# Patient Record
Sex: Male | Born: 1985 | Race: Black or African American | Hispanic: No | Marital: Married | Smoking: Never smoker
Health system: Southern US, Community
[De-identification: ages and names within clinical notes are randomized; demographics above are authoritative.]

## PROBLEM LIST (undated history)

## (undated) DIAGNOSIS — F32A Depression, unspecified: Secondary | ICD-10-CM

## (undated) DIAGNOSIS — J45909 Unspecified asthma, uncomplicated: Secondary | ICD-10-CM

## (undated) DIAGNOSIS — J301 Allergic rhinitis due to pollen: Secondary | ICD-10-CM

## (undated) HISTORY — DX: Unspecified asthma, uncomplicated: J45.909

## (undated) HISTORY — DX: Allergic rhinitis due to pollen: J30.1

## (undated) HISTORY — DX: Depression, unspecified: F32.A

## (undated) HISTORY — PX: LITHOTRIPSY: SUR834

---

## 2011-01-17 DIAGNOSIS — J309 Allergic rhinitis, unspecified: Secondary | ICD-10-CM | POA: Insufficient documentation

## 2012-04-14 DIAGNOSIS — J45909 Unspecified asthma, uncomplicated: Secondary | ICD-10-CM | POA: Insufficient documentation

## 2013-02-25 DIAGNOSIS — N200 Calculus of kidney: Secondary | ICD-10-CM | POA: Insufficient documentation

## 2014-03-03 DIAGNOSIS — E785 Hyperlipidemia, unspecified: Secondary | ICD-10-CM | POA: Insufficient documentation

## 2016-07-02 ENCOUNTER — Emergency Department: Payer: BC Managed Care – PPO

## 2016-07-02 ENCOUNTER — Emergency Department
Admission: EM | Admit: 2016-07-02 | Discharge: 2016-07-02 | Disposition: A | Discharge status: Home / Self Care | Payer: BC Managed Care – PPO | Attending: Emergency Medicine | Admitting: Emergency Medicine

## 2016-07-02 DIAGNOSIS — J9801 Acute bronchospasm: Secondary | ICD-10-CM

## 2016-07-02 DIAGNOSIS — R05 Cough: Secondary | ICD-10-CM | POA: Diagnosis present

## 2016-07-02 LAB — INFLUENZA PANEL BY PCR (TYPE A & B)
INFLAPCR: NEGATIVE
Influenza B By PCR: NEGATIVE

## 2016-07-02 MED ORDER — ALBUTEROL SULFATE HFA 108 (90 BASE) MCG/ACT IN AERS: 2.0000 | INHALATION_SPRAY | Freq: Four times a day (QID) | RESPIRATORY_TRACT | 0 refills | Status: AC | PRN

## 2016-07-02 MED ORDER — ALBUTEROL SULFATE (2.5 MG/3ML) 0.083% IN NEBU
5.0000 mg | INHALATION_SOLUTION | Freq: Once | RESPIRATORY_TRACT | Status: AC
  Administered 2016-07-02: 5 mg via RESPIRATORY_TRACT
  Filled 2016-07-02: qty 6

## 2016-07-02 NOTE — ED Triage Notes (Signed)
Pt ambulatory to triage with no difficulty. Pt reports started yesterday with cough and wheezing. Pt reports he has been out of his inhaler for awhile so did not have that to use. Pt talking in full and complete sentences with no difficulty at time. Pt denies chest pain, fever or other sx.

## 2016-07-02 NOTE — ED Provider Notes (Signed)
[facility] Emergency Department Provider Note ____________________________________________   I have reviewed the triage vital signs and the triage nursing note.  HISTORY  Chief Complaint Asthma and Cough   Historian Patient  HPI Patrick Wallace is a 31 y.o. male with a prior diagnosis of asthma, although he says his last issue with asthma was almost 10 years ago. Yesterday he started having a cough which got worse and wheezy over the course of the day and overnight. No fever. He has some aches but he thinks it is from coughing so much. No chest pain. No nausea or vomiting. No diarrhea. Minor nasal congestion. Feels improved after DuoNeb given in triage.    No past medical history on file. Asthma  There are no active problems to display for this patient.   No past surgical history on file.  Prior to Admission medications   Medication Sig Start Date End Date Taking? Authorizing Provider  albuterol (PROVENTIL HFA;VENTOLIN HFA) 108 (90 Base) MCG/ACT inhaler Inhale 2 puffs into the lungs every 6 (six) hours as needed for wheezing or shortness of breath. 07/02/16   Governor Rooksebecca [place] Droll, MD    No Known Allergies  No family history on file.  Social History Social History  Substance Use Topics  . Smoking status: Not on file  . Smokeless tobacco: Not on file  . Alcohol use Not on file    Review of Systems  Constitutional: Negative for fever. Eyes: Negative for visual changes. ENT: Negative for sore throat. Cardiovascular: Negative for chest pain. Respiratory: Positive for cough/shortness of breath. Gastrointestinal: Negative for abdominal pain, vomiting and diarrhea. Genitourinary: Negative for dysuria. Musculoskeletal: Negative for back pain. Skin: Negative for rash. Neurological: Negative for headache. 10 point Review of Systems otherwise negative ____________________________________________   PHYSICAL EXAM:  VITAL SIGNS: ED Triage Vitals  Enc Vitals  Group     BP 07/02/16 0511 135/66     Pulse Rate 07/02/16 0511 (!) 108     Resp 07/02/16 0511 (!) 22     Temp 07/02/16 0511 98.9 F (37.2 C)     Temp Source 07/02/16 0511 Axillary     SpO2 07/02/16 0511 100 %     Weight 07/02/16 0457 272 lb (123.4 kg)     Height 07/02/16 0457 5\' 10"  (1.778 m)     Head Circumference --      Peak Flow --      Pain Score 07/02/16 0457 0     Pain Loc --      Pain Edu? --      Excl. in GC? --      Constitutional: Alert and oriented. Well appearing and in no distress. HEENT   Head: Normocephalic and atraumatic.      Eyes: Conjunctivae are normal. PERRL. Normal extraocular movements.      Ears:         Nose: No congestion/rhinnorhea.   Mouth/Throat: Mucous membranes are moist.   Neck: No stridor. Cardiovascular/Chest: Normal rate, regular rhythm.  No murmurs, rubs, or gallops. Respiratory: Normal respiratory effort without tachypnea nor retractions. Slightly tight breath sounds, but no wheezing, rales or rhonchi at this point after DuoNeb. Gastrointestinal: Soft. No distention, no guarding, no rebound. Nontender. Obese  Genitourinary/rectal:Deferred Musculoskeletal: Nontender with normal range of motion in all extremities. No joint effusions.  No lower extremity tenderness.  No edema. Neurologic:  Normal speech and language. No gross or focal neurologic deficits are appreciated. Skin:  Skin is warm, dry and intact. No rash noted.  Psychiatric: Mood and affect are normal. Speech and behavior are normal. Patient exhibits appropriate insight and judgment.   ____________________________________________  LABS (pertinent positives/negatives)  Labs Reviewed  INFLUENZA PANEL BY PCR (TYPE A & B, H1N1)    ____________________________________________    EKG I, Governor Rooks, MD, the attending physician have personally viewed and interpreted all ECGs.  128 bpm.  Sinus Tachycardia.  Narrow QRS. Normal axis. Normal ST and  T-wave ____________________________________________  RADIOLOGY All Xrays were viewed by me. Imaging interpreted by Radiologist.  Chest x-ray 2 views: No acute pulmonary process. __________________________________________  PROCEDURES  Procedure(s) performed: None  Critical Care performed: None  ____________________________________________   ED COURSE / ASSESSMENT AND PLAN  Pertinent labs & imaging results that were available during my care of the patient were reviewed by me and considered in my medical decision making (see chart for details).   Patrick Wallace is overall well-appearing with a history of coughing/bronchospasm times one day which I'm seeing him after he received a DuoNeb treatment and he states he feels much better and his lungs are pretty clear only a slight amount of tightness appreciated. Given the fluid is so prevalent right now, I am checking him for the flu. Chest x-ray is negative for focal infiltrate.  Symptoms do not seem consistent with cardiac emergency, or PE.  I discussed with him that since he has not had no exacerbation in years, and symptoms seem essentially resolved with just albuterol, at this point I'm not recommending prednisone, although that would be next step if he has continued problems. He does have a primary care doctor that he says he can follow-up with.    CONSULTATIONS:   None  Patient / Family / Caregiver informed of clinical course, medical decision-making process, and agree with plan.   I discussed return precautions, follow-up instructions, and discharge instructions with patient and/or family.   ___________________________________________   FINAL CLINICAL IMPRESSION(S) / ED DIAGNOSES   Final diagnoses:  Bronchospasm, acute              Note: This dictation was prepared with Dragon dictation. Any transcriptional errors that result from this process are unintentional    Governor Rooks, MD 07/02/16 [phone]

## 2016-07-02 NOTE — Discharge Instructions (Signed)
You were evaluated for cough and wheezing and are being treated with albuterol inhaler.  We discussed, holding off on prednisone which is a steroid at this point in time because it seems like you're improving just with the albuterol. If you have worsening symptoms, this may be next step.  Return to the emergency room immediately for any worsening trouble breathing, shortness breath, chest pain, pain with breathing, dizziness or passing out, or any other symptoms concerning to you.

## 2016-07-02 NOTE — ED Notes (Signed)
EKG was signed off by DR. Zenda AlpersWebster

## 2017-12-28 IMAGING — CR DG CHEST 2V
2 series · 2 of 2 positions shown · non-contrast
Comparison: None.

CLINICAL DATA: Shortness of breath.  Cough and wheezing.

EXAM:
CHEST  2 VIEW

[chest pa]
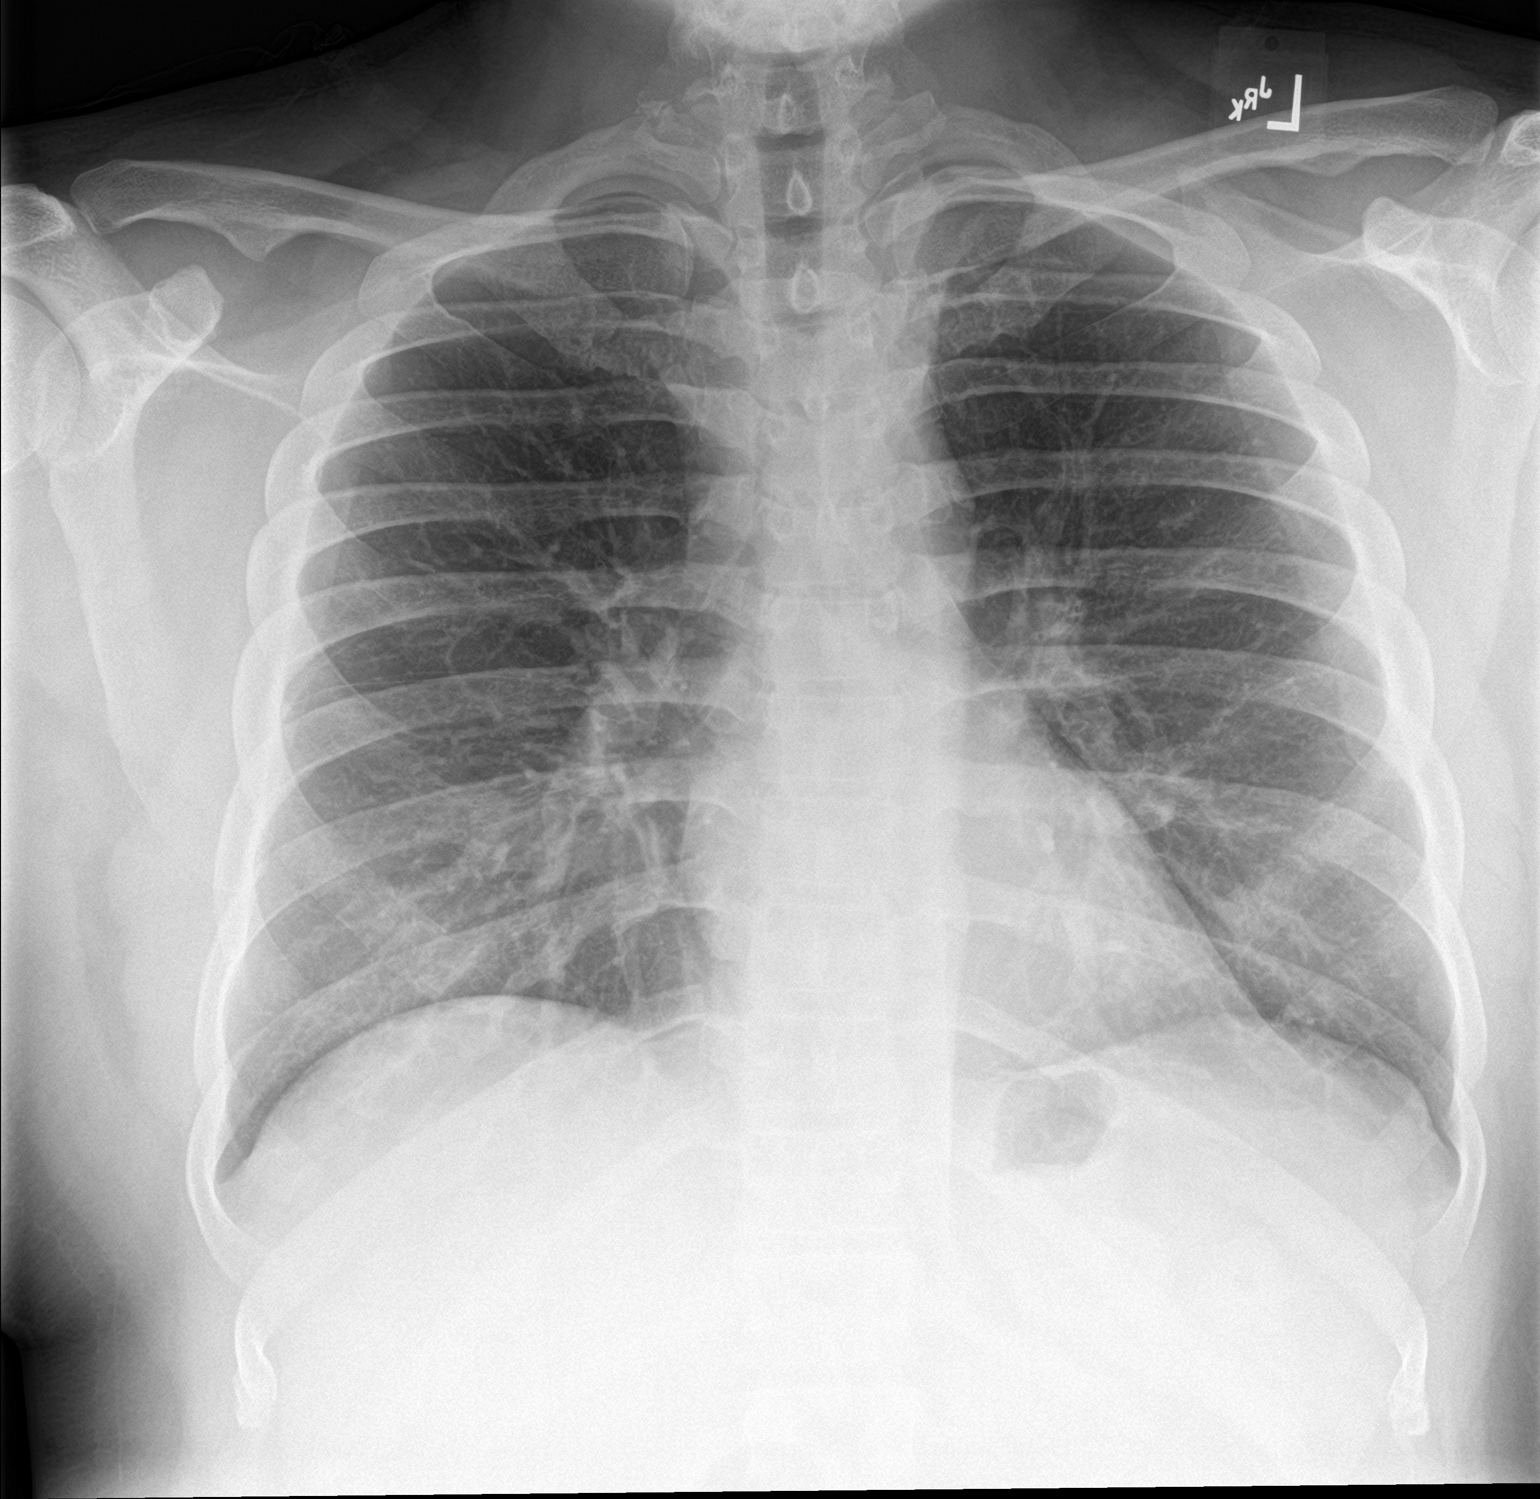

[chest lat]
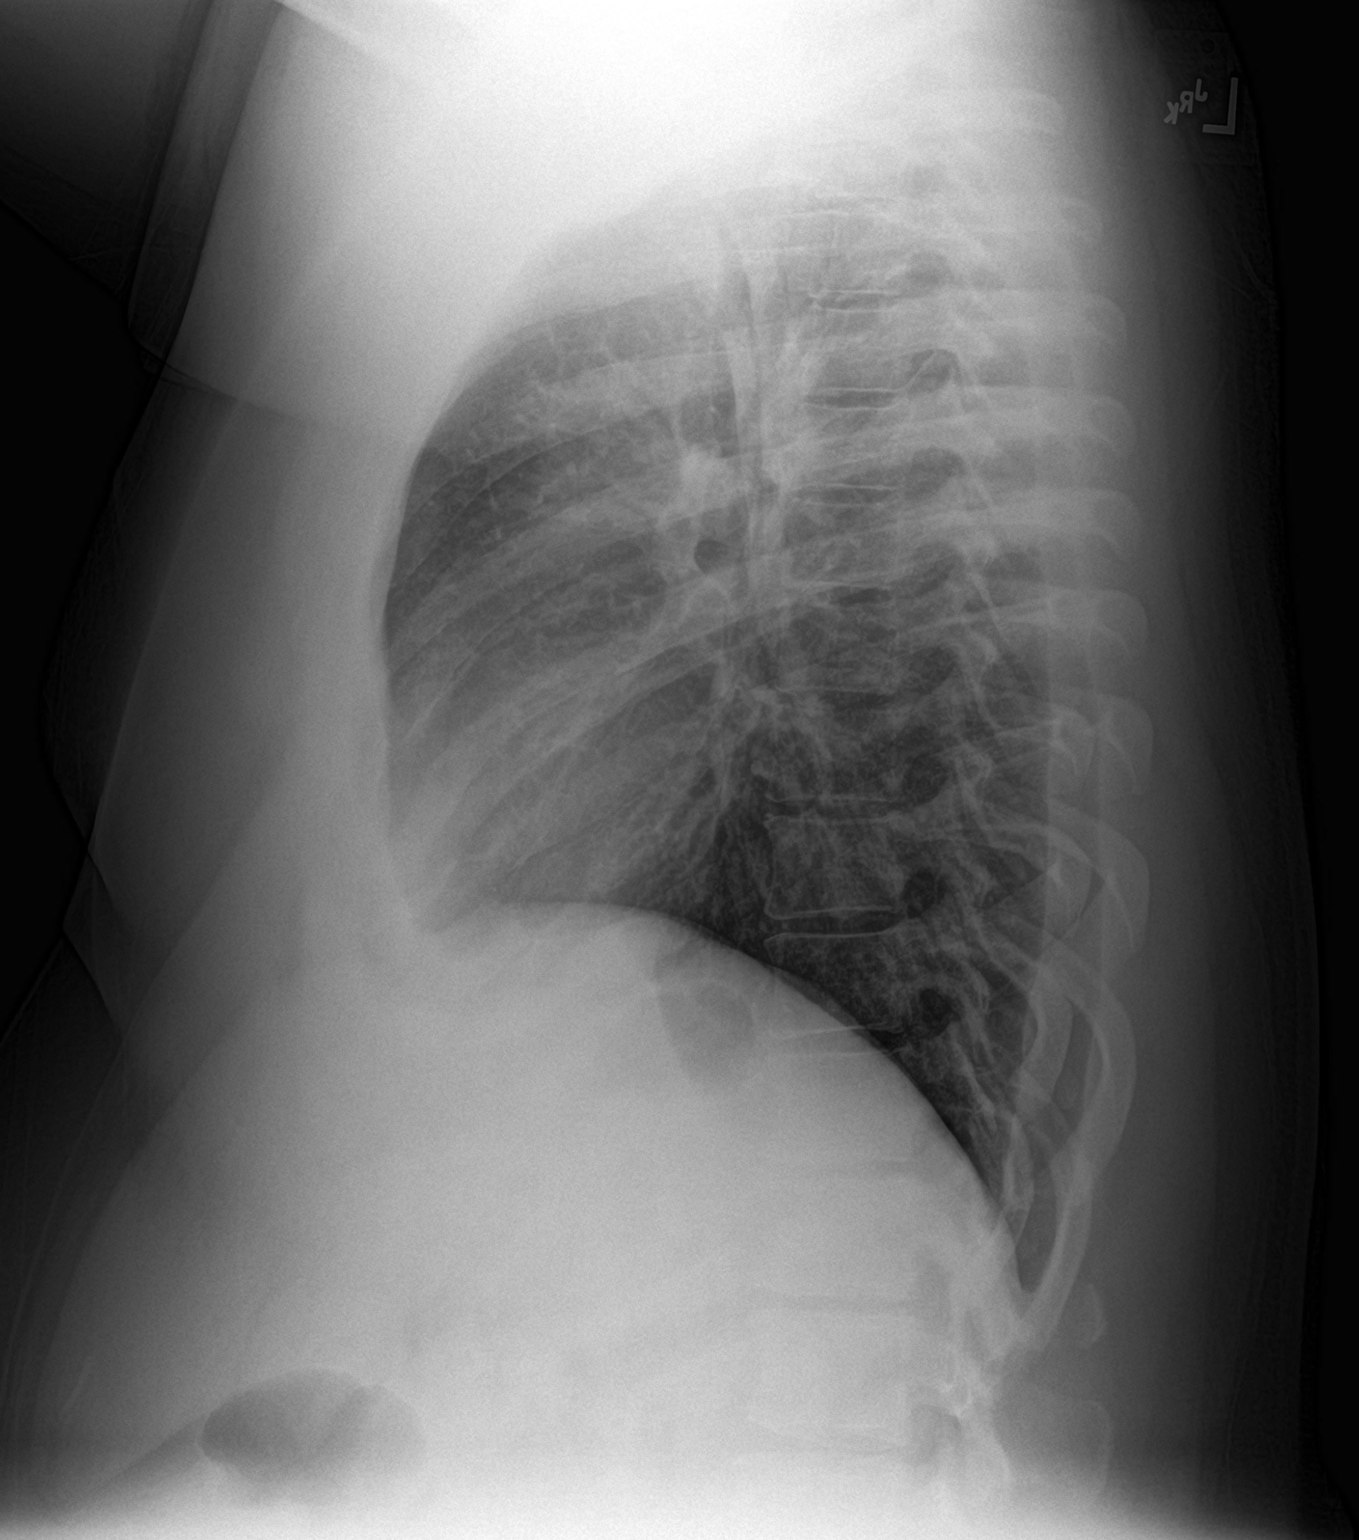

[2 of 2 positions shown; findings below may reference images not displayed]

FINDINGS: The cardiomediastinal contours are normal. The lungs are clear.
Pulmonary vasculature is normal. No consolidation, pleural effusion,
or pneumothorax. No acute osseous abnormalities are seen.
IMPRESSION: No acute pulmonary process.

## 2018-07-11 DIAGNOSIS — I10 Essential (primary) hypertension: Secondary | ICD-10-CM | POA: Insufficient documentation

## 2020-06-26 ENCOUNTER — Other Ambulatory Visit: Payer: Self-pay

## 2020-12-09 ENCOUNTER — Ambulatory Visit: Payer: BC Managed Care – PPO | Admitting: Allergy

## 2020-12-13 ENCOUNTER — Encounter: Payer: Self-pay | Admitting: Emergency Medicine

## 2020-12-13 ENCOUNTER — Other Ambulatory Visit: Payer: Self-pay

## 2020-12-13 ENCOUNTER — Ambulatory Visit: Payer: BC Managed Care – PPO | Admitting: Emergency Medicine

## 2020-12-13 VITALS — BP 138/90 | HR 81 | Temp 98.5°F | Ht 71.0 in | Wt 293.6 lb

## 2020-12-13 DIAGNOSIS — I10 Essential (primary) hypertension: Secondary | ICD-10-CM | POA: Diagnosis not present

## 2020-12-13 LAB — CBC WITH DIFFERENTIAL/PLATELET
Basophils Absolute: 0 10*3/uL (ref 0.0–0.1)
Basophils Relative: 0.2 % (ref 0.0–3.0)
Eosinophils Absolute: 0.1 10*3/uL (ref 0.0–0.7)
Eosinophils Relative: 1 % (ref 0.0–5.0)
HCT: 42.9 % (ref 39.0–52.0)
Hemoglobin: 14.6 g/dL (ref 13.0–17.0)
Lymphocytes Relative: 21.9 % (ref 12.0–46.0)
Lymphs Abs: 1.9 10*3/uL (ref 0.7–4.0)
MCHC: 34 g/dL (ref 30.0–36.0)
MCV: 90.1 fl (ref 78.0–100.0)
Monocytes Absolute: 1.1 10*3/uL — ABNORMAL HIGH (ref 0.1–1.0)
Monocytes Relative: 12.5 % — ABNORMAL HIGH (ref 3.0–12.0)
Neutro Abs: 5.6 10*3/uL (ref 1.4–7.7)
Neutrophils Relative %: 64.4 % (ref 43.0–77.0)
Platelets: 287 10*3/uL (ref 150.0–400.0)
RBC: 4.77 Mil/uL (ref 4.22–5.81)
RDW: 13.3 % (ref 11.5–15.5)
WBC: 8.6 10*3/uL (ref 4.0–10.5)

## 2020-12-13 LAB — COMPREHENSIVE METABOLIC PANEL
ALT: 31 U/L (ref 0–53)
AST: 25 U/L (ref 0–37)
Albumin: 4.5 g/dL (ref 3.5–5.2)
Alkaline Phosphatase: 78 U/L (ref 39–117)
BUN: 10 mg/dL (ref 6–23)
CO2: 27 mEq/L (ref 19–32)
Calcium: 9.1 mg/dL (ref 8.4–10.5)
Chloride: 98 mEq/L (ref 96–112)
Creatinine, Ser: 1.3 mg/dL (ref 0.40–1.50)
GFR: 71.57 mL/min (ref >60.00)
Glucose, Bld: 67 mg/dL — ABNORMAL LOW (ref 70–99)
Potassium: 3.8 mEq/L (ref 3.5–5.1)
Sodium: 135 mEq/L (ref 135–145)
Total Bilirubin: 0.8 mg/dL (ref 0.2–1.2)
Total Protein: 8 g/dL (ref 6.0–8.3)

## 2020-12-13 LAB — LIPID PANEL
Cholesterol: 353 mg/dL — ABNORMAL HIGH (ref 0–200)
HDL: 31 mg/dL — ABNORMAL LOW (ref >39.00)
Total CHOL/HDL Ratio: 11
Triglycerides: 552 mg/dL — ABNORMAL HIGH (ref 0.0–149.0)

## 2020-12-13 LAB — HEMOGLOBIN A1C: Hgb A1c MFr Bld: 5.3 % (ref 4.6–6.5)

## 2020-12-13 LAB — LDL CHOLESTEROL, DIRECT: Direct LDL: 66 mg/dL

## 2020-12-13 MED ORDER — AMLODIPINE BESYLATE 5 MG PO TABS: 5.0000 mg | ORAL_TABLET | Freq: Every day | ORAL | 3 refills | Status: DC

## 2020-12-13 NOTE — Assessment & Plan Note (Signed)
Elevated blood pressure readings with a history of hypertension. Will start amlodipine 5 mg daily. Dietary approaches to stop hypertension discussed Follow-up in 3 months.

## 2020-12-13 NOTE — Progress Notes (Signed)
Patrick Wallace 35 y.o.   Chief Complaint  Patient presents with   New Patient (Initial Visit)    HISTORY OF PRESENT ILLNESS: This is a 35 y.o. male first visit to this office, here to establish care with me. History of hypertension but not on any medications at present time History of asthma with infrequent use of albuterol inhaler Non-smoker. Married father of 3. Has occasional carpal tunnel symptoms to both wrists but not affecting quality of life.  HPI   Prior to Admission medications   Medication Sig Start Date End Date Taking? Authorizing Provider  albuterol (PROVENTIL HFA;VENTOLIN HFA) 108 (90 Base) MCG/ACT inhaler Inhale 2 puffs into the lungs every 6 (six) hours as needed for wheezing or shortness of breath. 07/02/16   Governor Rooks, MD    No Known Allergies  There are no problems to display for this patient.   History reviewed. No pertinent past medical history.  History reviewed. No pertinent surgical history.  Social History   Socioeconomic History   Marital status: Married    Spouse name: Not on file   Number of children: Not on file   Years of education: Not on file   Highest education level: Not on file  Occupational History   Not on file  Tobacco Use   Smoking status: Not on file   Smokeless tobacco: Not on file  Substance and Sexual Activity   Alcohol use: Not on file   Drug use: Not on file   Sexual activity: Not on file  Other Topics Concern   Not on file  Social History Narrative   ** Merged History Encounter **       Social Determinants of Health   Financial Resource Strain: Not on file  Food Insecurity: Not on file  Transportation Needs: Not on file  Physical Activity: Not on file  Stress: Not on file  Social Connections: Not on file  Intimate Partner Violence: Not on file    History reviewed. No pertinent family history.   Review of Systems  Constitutional: Negative.  Negative for chills and fever.  HENT: Negative.  Negative  for congestion and sore throat.   Respiratory: Negative.  Negative for cough and shortness of breath.   Cardiovascular: Negative.  Negative for chest pain and palpitations.  Gastrointestinal:  Negative for abdominal pain, diarrhea, nausea and vomiting.  Genitourinary: Negative.  Negative for dysuria and hematuria.  Musculoskeletal: Negative.  Negative for myalgias.  Skin: Negative.  Negative for rash.  Neurological:  Negative for dizziness and headaches.  All other systems reviewed and are negative.   Physical Exam Vitals reviewed.  Constitutional:      Appearance: Normal appearance.  HENT:     Head: Normocephalic.  Eyes:     Extraocular Movements: Extraocular movements intact.     Conjunctiva/sclera: Conjunctivae normal.     Pupils: Pupils are equal, round, and reactive to light.  Cardiovascular:     Rate and Rhythm: Normal rate and regular rhythm.     Pulses: Normal pulses.     Heart sounds: Normal heart sounds.  Pulmonary:     Effort: Pulmonary effort is normal.     Breath sounds: Normal breath sounds.  Musculoskeletal:        General: Normal range of motion.     Cervical back: Normal range of motion and neck supple.  Skin:    General: Skin is warm and dry.  Neurological:     General: No focal deficit present.  Mental Status: He is alert and oriented to person, place, and time.  Psychiatric:        Mood and Affect: Mood normal.        Behavior: Behavior normal.     ASSESSMENT & PLAN: A total of 30 minutes was spent with the patient and counseling/coordination of care regarding establishing care with me, hypertension and cardiovascular risk associated with this condition, need to start medication amlodipine 5 mg daily, dietary approach to stop hypertension, education on nutrition and need for nutritionist referral, prognosis, documentation and need for follow-up in 3 months.  Essential hypertension Elevated blood pressure readings with a history of  hypertension. Will start amlodipine 5 mg daily. Dietary approaches to stop hypertension discussed Follow-up in 3 months.  Morbid obesity (HCC) Diet and nutrition discussed.  Advised to decrease the amount of daily carbohydrate intake Referred to medical weight management clinic. Patrick Wallace was seen today for new patient (initial visit), carpal tunnel and hypertension.  Diagnoses and all orders for this visit:  Essential hypertension -     CBC with Differential/Platelet -     Comprehensive metabolic panel -     Hemoglobin A1c -     Lipid panel -     amLODipine (NORVASC) 5 MG tablet; Take 1 tablet (5 mg total) by mouth daily.  Morbid obesity (HCC) -     Hemoglobin A1c -     Lipid panel -     Amb Ref to Medical Weight Management  Patient Instructions  Hypertension, Adult High blood pressure (hypertension) is when the force of blood pumping through the arteries is too strong. The arteries are the blood vessels that carry blood from the heart throughout the body. Hypertension forces the heart to work harder to pump blood and may cause arteries to become narrow or stiff. Untreated or uncontrolled hypertension can cause a heart attack, heart failure, a stroke, kidney disease, and otherproblems. A blood pressure reading consists of a higher number over a lower number. Ideally, your blood pressure should be below 120/80. The first ("top") number is called the systolic pressure. It is a measure of the pressure in your arteries as your heart beats. The second ("bottom") number is called the diastolic pressure. It is a measure of the pressure in your arteries as theheart relaxes. What are the causes? The exact cause of this condition is not known. There are some conditions thatresult in or are related to high blood pressure. What increases the risk? Some risk factors for high blood pressure are under your control. The following factors may make you more likely to develop this condition: Smoking. Having  type 2 diabetes mellitus, high cholesterol, or both. Not getting enough exercise or physical activity. Being overweight. Having too much fat, sugar, calories, or salt (sodium) in your diet. Drinking too much alcohol. Some risk factors for high blood pressure may be difficult or impossible to change. Some of these factors include: Having chronic kidney disease. Having a family history of high blood pressure. Age. Risk increases with age. Race. You may be at higher risk if you are African American. Gender. Men are at higher risk than women before age 8. After age 30, women are at higher risk than men. Having obstructive sleep apnea. Stress. What are the signs or symptoms? High blood pressure may not cause symptoms. Very high blood pressure (hypertensive crisis) may cause: Headache. Anxiety. Shortness of breath. Nosebleed. Nausea and vomiting. Vision changes. Severe chest pain. Seizures. How is this diagnosed? This condition  is diagnosed by measuring your blood pressure while you are seated, with your arm resting on a flat surface, your legs uncrossed, and your feet flat on the floor. The cuff of the blood pressure monitor will be placed directly against the skin of your upper arm at the level of your heart. It should be measured at least twice using the same arm. Certain conditions cancause a difference in blood pressure between your right and left arms. Certain factors can cause blood pressure readings to be lower or higher than normal for a short period of time: When your blood pressure is higher when you are in a health care provider's office than when you are at home, this is called white coat hypertension. Most people with this condition do not need medicines. When your blood pressure is higher at home than when you are in a health care provider's office, this is called masked hypertension. Most people with this condition may need medicines to control blood pressure. If you have a  high blood pressure reading during one visit or you have normal blood pressure with other risk factors, you may be asked to: Return on a different day to have your blood pressure checked again. Monitor your blood pressure at home for 1 week or longer. If you are diagnosed with hypertension, you may have other blood or imaging tests to help your health care provider understand your overall risk for otherconditions. How is this treated? This condition is treated by making healthy lifestyle changes, such as eating healthy foods, exercising more, and reducing your alcohol intake. Your health care provider may prescribe medicine if lifestyle changes are not enough to get your blood pressure under control, and if: Your systolic blood pressure is above 130. Your diastolic blood pressure is above 80. Your personal target blood pressure may vary depending on your medicalconditions, your age, and other factors. Follow these instructions at home: Eating and drinking  Eat a diet that is high in fiber and potassium, and low in sodium, added sugar, and fat. An example eating plan is called the DASH (Dietary Approaches to Stop Hypertension) diet. To eat this way: Eat plenty of fresh fruits and vegetables. Try to fill one half of your plate at each meal with fruits and vegetables. Eat whole grains, such as whole-wheat pasta, brown rice, or whole-grain bread. Fill about one fourth of your plate with whole grains. Eat or drink low-fat dairy products, such as skim milk or low-fat yogurt. Avoid fatty cuts of meat, processed or cured meats, and poultry with skin. Fill about one fourth of your plate with lean proteins, such as fish, chicken without skin, beans, eggs, or tofu. Avoid pre-made and processed foods. These tend to be higher in sodium, added sugar, and fat. Reduce your daily sodium intake. Most people with hypertension should eat less than 1,500 mg of sodium a day. Do not drink alcohol if: Your health care  provider tells you not to drink. You are pregnant, may be pregnant, or are planning to become pregnant. If you drink alcohol: Limit how much you use to: 0-1 drink a day for women. 0-2 drinks a day for men. Be aware of how much alcohol is in your drink. In the [place] one drink equals one 12 oz bottle of beer (355 mL), one 5 oz glass of wine (148 mL), or one 1 oz glass of hard liquor (44 mL).  Lifestyle  Work with your health care provider to maintain a healthy body weight or to  lose weight. Ask what an ideal weight is for you. Get at least 30 minutes of exercise most days of the week. Activities may include walking, swimming, or biking. Include exercise to strengthen your muscles (resistance exercise), such as Pilates or lifting weights, as part of your weekly exercise routine. Try to do these types of exercises for 30 minutes at least 3 days a week. Do not use any products that contain nicotine or tobacco, such as cigarettes, e-cigarettes, and chewing tobacco. If you need help quitting, ask your health care provider. Monitor your blood pressure at home as told by your health care provider. Keep all follow-up visits as told by your health care provider. This is important.  Medicines Take over-the-counter and prescription medicines only as told by your health care provider. Follow directions carefully. Blood pressure medicines must be taken as prescribed. Do not skip doses of blood pressure medicine. Doing this puts you at risk for problems and can make the medicine less effective. Ask your health care provider about side effects or reactions to medicines that you should watch for. Contact a health care provider if you: Think you are having a reaction to a medicine you are taking. Have headaches that keep coming back (recurring). Feel dizzy. Have swelling in your ankles. Have trouble with your vision. Get help right away if you: Develop a severe headache or confusion. Have unusual weakness  or numbness. Feel faint. Have severe pain in your chest or abdomen. Vomit repeatedly. Have trouble breathing. Summary Hypertension is when the force of blood pumping through your arteries is too strong. If this condition is not controlled, it may put you at risk for serious complications. Your personal target blood pressure may vary depending on your medical conditions, your age, and other factors. For most people, a normal blood pressure is less than 120/80. Hypertension is treated with lifestyle changes, medicines, or a combination of both. Lifestyle changes include losing weight, eating a healthy, low-sodium diet, exercising more, and limiting alcohol. This information is not intended to replace advice given to you by your health care provider. Make sure you discuss any questions you have with your healthcare provider. Document Revised: 02/12/2018 Document Reviewed: 02/12/2018 Elsevier Patient Education  2022 [employer].   Edwina BarthMiguel Jamilex Bohnsack, MD [facility]

## 2020-12-13 NOTE — Patient Instructions (Signed)

## 2020-12-13 NOTE — Assessment & Plan Note (Signed)
Diet and nutrition discussed.  Advised to decrease the amount of daily carbohydrate intake Referred to medical weight management clinic.

## 2020-12-14 ENCOUNTER — Other Ambulatory Visit: Payer: Self-pay | Admitting: Emergency Medicine

## 2020-12-14 DIAGNOSIS — E782 Mixed hyperlipidemia: Secondary | ICD-10-CM

## 2020-12-14 MED ORDER — ROSUVASTATIN CALCIUM 20 MG PO TABS: 20.0000 mg | ORAL_TABLET | Freq: Every day | ORAL | 3 refills | Status: DC

## 2020-12-16 ENCOUNTER — Encounter: Payer: Self-pay | Admitting: *Deleted

## 2020-12-17 ENCOUNTER — Ambulatory Visit
Admission: EM | Admit: 2020-12-17 | Discharge: 2020-12-17 | Disposition: A | Discharge status: Home / Self Care | Payer: BC Managed Care – PPO | Attending: Emergency Medicine | Admitting: Emergency Medicine

## 2020-12-17 ENCOUNTER — Encounter: Payer: Self-pay | Admitting: Emergency Medicine

## 2020-12-17 ENCOUNTER — Other Ambulatory Visit: Payer: Self-pay

## 2020-12-17 ENCOUNTER — Ambulatory Visit (INDEPENDENT_AMBULATORY_CARE_PROVIDER_SITE_OTHER): Payer: BC Managed Care – PPO

## 2020-12-17 DIAGNOSIS — K529 Noninfective gastroenteritis and colitis, unspecified: Secondary | ICD-10-CM | POA: Diagnosis not present

## 2020-12-17 DIAGNOSIS — R109 Unspecified abdominal pain: Secondary | ICD-10-CM | POA: Diagnosis not present

## 2020-12-17 DIAGNOSIS — R197 Diarrhea, unspecified: Secondary | ICD-10-CM | POA: Diagnosis not present

## 2020-12-17 LAB — POCT URINALYSIS DIP (DEVICE)
Bilirubin Urine: NEGATIVE
Glucose, UA: NEGATIVE mg/dL
Ketones, ur: NEGATIVE mg/dL
Leukocytes,Ua: NEGATIVE
Nitrite: NEGATIVE
Protein, ur: 100 mg/dL — AB
Specific Gravity, Urine: 1.025 (ref 1.005–1.030)
Urobilinogen, UA: 0.2 mg/dL (ref 0.0–1.0)
pH: 5.5 (ref 5.0–8.0)

## 2020-12-17 MED ORDER — ONDANSETRON 8 MG PO TBDP: 8.0000 mg | ORAL_TABLET | Freq: Three times a day (TID) | ORAL | 0 refills | Status: DC | PRN

## 2020-12-17 NOTE — ED Triage Notes (Signed)
Sunday had diarrhea and abdominal pain, and nauseated.  Symptoms have continued.    Child had similar symptoms prior to patient having symptoms.    Patient has right lower back pain.  Patient is concerned he is having a kidney stone, history of the same

## 2020-12-17 NOTE — ED Provider Notes (Signed)
[facility]    CSN: [id number] Arrival date & time: 12/17/20  1155      History   Chief Complaint Chief Complaint  Patient presents with   Abdominal Pain    HPI Kalik Alvizo is a 35 y.o. male.   HPI  35 year old male here for evaluation of abdominal complaints.  Patient reports that he has been experiencing abdominal cramping, which is mostly resolved, for the last 5 days.  He is also had some right-sided low back pain that will come and go in short bursts and then disperse.  This has been associated with chills and sweats.  He is also been experiencing darker urine that is cloudy in appearance.  His stools have been diarrhea with 5-6 episodes a day.  He denies any vomiting, blood in his stool or urine, painful urination, or fever.  Past Medical History:  Diagnosis Date   Asthma    Depression    Hay fever    with allergies    Patient Active Problem List   Diagnosis Date Noted   Essential hypertension 12/13/2020   Morbid obesity (HCC) 12/13/2020    Past Surgical History:  Procedure Laterality Date   LITHOTRIPSY         Home Medications    Prior to Admission medications   Medication Sig Start Date End Date Taking? Authorizing Provider  amLODipine (NORVASC) 5 MG tablet Take 1 tablet (5 mg total) by mouth daily. 12/13/20  Yes Sagardia, Eilleen Kempf, MD  ondansetron (ZOFRAN ODT) 8 MG disintegrating tablet Take 1 tablet (8 mg total) by mouth every 8 (eight) hours as needed for nausea or vomiting. 12/17/20  Yes Becky Augusta, NP  rosuvastatin (CRESTOR) 20 MG tablet Take 1 tablet (20 mg total) by mouth daily. 12/14/20  Yes Sagardia, Eilleen Kempf, MD  albuterol (PROVENTIL HFA;VENTOLIN HFA) 108 (90 Base) MCG/ACT inhaler Inhale 2 puffs into the lungs every 6 (six) hours as needed for wheezing or shortness of breath. 07/02/16   Governor Rooks, MD    Family History Family History  Problem Relation Age of Onset   Hypertension Mother    Hyperlipidemia Mother     Hypertension Father    Hyperlipidemia Father    Diabetes Father    Cancer Father    Depression Father    Heart disease Father    Kidney disease Maternal Grandfather    Hypertension Maternal Grandfather    Hyperlipidemia Maternal Grandfather    Diabetes Maternal Grandfather    Hyperlipidemia Paternal Grandmother    Hypertension Paternal Grandmother    Heart attack Paternal Grandmother    Heart disease Paternal Grandmother     Social History Social History   Tobacco Use   Smoking status: Never   Smokeless tobacco: Never  Vaping Use   Vaping Use: Never used  Substance Use Topics   Alcohol use: Yes    Alcohol/week: 2.0 standard drinks    Types: 2 Glasses of wine per week     Allergies   Patient has no known allergies.   Review of Systems Review of Systems  Constitutional:  Positive for chills and diaphoresis. Negative for activity change, appetite change and fever.  Gastrointestinal:  Positive for abdominal pain, diarrhea and nausea. Negative for blood in stool and vomiting.  Genitourinary:  Negative for dysuria, frequency and urgency.  Musculoskeletal:  Positive for back pain.  Hematological: Negative.   Psychiatric/Behavioral: Negative.      Physical Exam Triage Vital Signs ED Triage Vitals [12/17/20 1254]  Enc Vitals  Group     BP      Pulse      Resp      Temp      Temp src      SpO2      Weight      Height      Head Circumference      Peak Flow      Pain Score 3     Pain Loc      Pain Edu?      Excl. in GC?    No data found.  Updated Vital Signs BP (!) 161/93 (BP Location: Right Arm) Comment: recently started taking blood pressure medicine-12/15/2020 Comment (BP Location): large cuff  Pulse 77   Temp 98 F (36.7 C) (Oral)   Resp (!) 22   SpO2 99%   Visual Acuity Right Eye Distance:   Left Eye Distance:   Bilateral Distance:    Right Eye Near:   Left Eye Near:    Bilateral Near:     Physical Exam Vitals and nursing note reviewed.   Constitutional:      General: He is not in acute distress.    Appearance: He is well-developed. He is not ill-appearing.  HENT:     Head: Normocephalic and atraumatic.  Cardiovascular:     Rate and Rhythm: Normal rate and regular rhythm.     Pulses: Normal pulses.     Heart sounds: Normal heart sounds. No murmur heard.   No gallop.  Pulmonary:     Effort: Pulmonary effort is normal.     Breath sounds: Normal breath sounds. No wheezing, rhonchi or rales.  Abdominal:     General: Bowel sounds are normal. There is no distension.     Palpations: Abdomen is soft.     Tenderness: There is no abdominal tenderness. There is no right CVA tenderness, left CVA tenderness, guarding or rebound.  Skin:    General: Skin is warm and dry.     Capillary Refill: Capillary refill takes less than 2 seconds.  Neurological:     General: No focal deficit present.     Mental Status: He is alert and oriented to person, place, and time.  Psychiatric:        Mood and Affect: Mood normal.        Behavior: Behavior normal.        Thought Content: Thought content normal.        Judgment: Judgment normal.     UC Treatments / Results  Labs (all labs ordered are listed, but only abnormal results are displayed) Labs Reviewed  POCT URINALYSIS DIP (DEVICE) - Abnormal; Notable for the following components:      Result Value   Hgb urine dipstick SMALL (*)    Protein, ur 100 (*)    All other components within normal limits  POCT URINALYSIS DIPSTICK, ED / UC    EKG   Radiology DG Abdomen 1 View  Result Date: 12/17/2020 CLINICAL DATA:  Acute abdominal pain or diarrhea. EXAM: ABDOMEN - 1 VIEW COMPARISON:  None. FINDINGS: The bowel gas pattern is normal. No radio-opaque calculi or other significant radiographic abnormality are seen. IMPRESSION: Negative. Electronically Signed   By: Harmon Pier M.D.   On: 12/17/2020 13:32    Procedures Procedures (including critical care time)  Medications Ordered in  UC Medications - No data to display  Initial Impression / Assessment and Plan / UC Course  I have reviewed the triage vital signs and  the nursing notes.  Pertinent labs & imaging results that were available during my care of the patient were reviewed by me and considered in my medical decision making (see chart for details).  Patient is a very pleasant and nontoxic-appearing 35 year old male here for evaluation of abdominal complaints as outlined in HPI above.  Patient's physical exam reveals a benign cardiopulmonary exam.  Abdomen is protuberant but soft and nontender with positive bowel sounds all 4 quadrants.  Patient has no CVA tenderness on exam.  Will obtain UA and KUB to evaluate for stone and urinary tract infection and the presence of blood in the urine.  Radiology trepidation of KUB is negative for stone or constipation.  Urinalysis shows small hemoglobin, 100 protein, and has a high specific gravity of 1.025.  Patient is no significant findings on his lab work and there is no evidence of stone.  Will discharge patient home with diagnosis of gastroenteritis and give Zofran to help with nausea.  I will encourage him to increase his oral fluid intake to improve hydration.   Final Clinical Impressions(s) / UC Diagnoses   Final diagnoses:  Noninfectious gastroenteritis, unspecified type     Discharge Instructions      Use the Zofran every 8 hours as needed for nausea.  Drink small, frequent sips of clear fluid such as broth, ginger ale, water, Pedialyte, Jell-O, or popsicles to help improve your hydration.  If you tolerate clear liquids well you can slowly advance her diet to bland foods as tolerated.  Return for reevaluation for any new or worsening symptoms.     ED Prescriptions     Medication Sig Dispense Auth. Provider   ondansetron (ZOFRAN ODT) 8 MG disintegrating tablet Take 1 tablet (8 mg total) by mouth every 8 (eight) hours as needed for nausea or vomiting. 20  tablet Becky Augusta, NP      PDMP not reviewed this encounter.   Becky Augusta, NP 12/17/20 1407

## 2020-12-17 NOTE — Discharge Instructions (Addendum)
Use the Zofran every 8 hours as needed for nausea.  Drink small, frequent sips of clear fluid such as broth, ginger ale, water, Pedialyte, Jell-O, or popsicles to help improve your hydration.  If you tolerate clear liquids well you can slowly advance her diet to bland foods as tolerated.  Return for reevaluation for any new or worsening symptoms.

## 2020-12-26 ENCOUNTER — Ambulatory Visit: Payer: BC Managed Care – PPO | Admitting: Allergy

## 2020-12-26 ENCOUNTER — Telehealth: Payer: Self-pay

## 2020-12-26 NOTE — Telephone Encounter (Signed)
Called and left a message for patient to call our office to reschedule an appointment if wanted.

## 2020-12-26 NOTE — Progress Notes (Deleted)
New Patient Note  RE: Patrick Wallace MRN: [id number] DOB: May 20, 1986 Date of Office Visit: 12/26/2020  Consult requested by: No ref. provider found Primary care provider: Georgina Quint, MD  Chief Complaint: No chief complaint on file.  History of Present Illness: I had the pleasure of seeing Patrick Wallace for initial evaluation at the [facility] on 12/26/2020. He is a 35 y.o. male, who is referred here by Georgina Quint, MD for the evaluation of allergies.  He reports symptoms of ***. Symptoms have been going on for *** years. The symptoms are present *** all year around with worsening in ***. Other triggers include exposure to ***. Anosmia: ***. Headache: ***. He has used *** with ***fair improvement in symptoms. Sinus infections: ***. Previous work up includes: ***. Previous ENT evaluation: ***. Previous sinus imaging: ***. History of nasal polyps: ***. Last eye exam: ***. History of reflux: ***.   Assessment and Plan: Senai is a 35 y.o. male with: No problem-specific Assessment & Plan notes found for this encounter.  No follow-ups on file.  No orders of the defined types were placed in this encounter.  Lab Orders  No laboratory test(s) ordered today    Other allergy screening: Asthma: {Blank single:19197::"yes","no"} Rhino conjunctivitis: {Blank single:19197::"yes","no"} Food allergy: {Blank single:19197::"yes","no"} Medication allergy: {Blank single:19197::"yes","no"} Hymenoptera allergy: {Blank single:19197::"yes","no"} Urticaria: {Blank single:19197::"yes","no"} Eczema:{Blank single:19197::"yes","no"} History of recurrent infections suggestive of immunodeficency: {Blank single:19197::"yes","no"}  Diagnostics: Spirometry:  Tracings reviewed. His effort: {Blank single:19197::"Good reproducible efforts.","It was hard to get consistent efforts and there is a question as to whether this reflects a maximal maneuver.","Poor effort, data can not  be interpreted."} FVC: ***L FEV1: ***L, ***% predicted FEV1/FVC ratio: ***% Interpretation: {Blank single:19197::"Spirometry consistent with mild obstructive disease","Spirometry consistent with moderate obstructive disease","Spirometry consistent with severe obstructive disease","Spirometry consistent with possible restrictive disease","Spirometry consistent with mixed obstructive and restrictive disease","Spirometry uninterpretable due to technique","Spirometry consistent with normal pattern","No overt abnormalities noted given today's efforts"}.  Please see scanned spirometry results for details.  Skin Testing: {Blank single:19197::"Select foods","Environmental allergy panel","Environmental allergy panel and select foods","Food allergy panel","None","Deferred due to recent antihistamines use"}. Positive test to: ***. Negative test to: ***.  Results discussed with patient/family.   Past Medical History: Patient Active Problem List   Diagnosis Date Noted  . Essential hypertension 12/13/2020  . Morbid obesity (HCC) 12/13/2020   Past Medical History:  Diagnosis Date  . Asthma   . Depression   . Hay fever    with allergies   Past Surgical History: Past Surgical History:  Procedure Laterality Date  . LITHOTRIPSY     Medication List:  Current Outpatient Medications  Medication Sig Dispense Refill  . albuterol (PROVENTIL HFA;VENTOLIN HFA) 108 (90 Base) MCG/ACT inhaler Inhale 2 puffs into the lungs every 6 (six) hours as needed for wheezing or shortness of breath. 1 Inhaler 0  . amLODipine (NORVASC) 5 MG tablet Take 1 tablet (5 mg total) by mouth daily. 90 tablet 3  . ondansetron (ZOFRAN ODT) 8 MG disintegrating tablet Take 1 tablet (8 mg total) by mouth every 8 (eight) hours as needed for nausea or vomiting. 20 tablet 0  . rosuvastatin (CRESTOR) 20 MG tablet Take 1 tablet (20 mg total) by mouth daily. 90 tablet 3   No current facility-administered medications for this visit.    Allergies: No Known Allergies Social History: Social History   Socioeconomic History  . Marital status: Married    Spouse name: Not on file  . Number of  children: Not on file  . Years of education: Not on file  . Highest education level: Not on file  Occupational History  . Not on file  Tobacco Use  . Smoking status: Never  . Smokeless tobacco: Never  Vaping Use  . Vaping Use: Never used  Substance and Sexual Activity  . Alcohol use: Yes    Alcohol/week: 2.0 standard drinks    Types: 2 Glasses of wine per week  . Drug use: Not on file  . Sexual activity: Not on file  Other Topics Concern  . Not on file  Social History Narrative   ** Merged History Encounter **       Social Determinants of Health   Financial Resource Strain: Not on file  Food Insecurity: Not on file  Transportation Needs: Not on file  Physical Activity: Not on file  Stress: Not on file  Social Connections: Not on file   Lives in a ***. Smoking: *** Occupation: ***  Environmental HistorySurveyor, minerals in the house: Copywriter, advertising in the family room: {Blank single:19197::"yes","no"} Carpet in the bedroom: {Blank single:19197::"yes","no"} Heating: {Blank single:19197::"electric","gas","heat pump"} Cooling: {Blank single:19197::"central","window","heat pump"} Pet: {Blank single:19197::"yes ***","no"}  Family History: Family History  Problem Relation Age of Onset  . Hypertension Mother   . Hyperlipidemia Mother   . Hypertension Father   . Hyperlipidemia Father   . Diabetes Father   . Cancer Father   . Depression Father   . Heart disease Father   . Kidney disease Maternal Grandfather   . Hypertension Maternal Grandfather   . Hyperlipidemia Maternal Grandfather   . Diabetes Maternal Grandfather   . Hyperlipidemia Paternal Grandmother   . Hypertension Paternal Grandmother   . Heart attack Paternal Grandmother   . Heart disease Paternal Grandmother     Problem                               Relation Asthma                                   *** Eczema                                *** Food allergy                          *** Allergic rhino conjunctivitis     ***  Review of Systems  Constitutional:  Negative for appetite change, chills, fever and unexpected weight change.  HENT:  Negative for congestion and rhinorrhea.   Eyes:  Negative for itching.  Respiratory:  Negative for cough, chest tightness, shortness of breath and wheezing.   Cardiovascular:  Negative for chest pain.  Gastrointestinal:  Negative for abdominal pain.  Genitourinary:  Negative for difficulty urinating.  Skin:  Negative for rash.  Neurological:  Negative for headaches.   Objective: There were no vitals taken for this visit. There is no height or weight on file to calculate BMI. Physical Exam Vitals and nursing note reviewed.  Constitutional:      Appearance: Normal appearance. He is well-developed.  HENT:     Head: Normocephalic and atraumatic.     Right Ear: External ear normal.     Left Ear: External ear normal.     Nose:  Nose normal.     Mouth/Throat:     Mouth: Mucous membranes are moist.     Pharynx: Oropharynx is clear.  Eyes:     Conjunctiva/sclera: Conjunctivae normal.  Cardiovascular:     Rate and Rhythm: Normal rate and regular rhythm.     Heart sounds: Normal heart sounds. No murmur heard.   No friction rub. No gallop.  Pulmonary:     Effort: Pulmonary effort is normal.     Breath sounds: Normal breath sounds. No wheezing, rhonchi or rales.  Abdominal:     Palpations: Abdomen is soft.  Musculoskeletal:     Cervical back: Neck supple.  Skin:    General: Skin is warm.     Findings: No rash.  Neurological:     Mental Status: He is alert and oriented to person, place, and time.  Psychiatric:        Behavior: Behavior normal.  The plan was reviewed with the patient/family, and all questions/concerned were addressed.  It was my  pleasure to see Ender today and participate in his care. Please feel free to contact me with any questions or concerns.  Sincerely,  Wyline Mood, DO Allergy & Immunology  [facility] office: [phone] [facility] office: [phone]

## 2021-03-15 ENCOUNTER — Ambulatory Visit: Payer: BC Managed Care – PPO | Admitting: Emergency Medicine

## 2021-04-08 DIAGNOSIS — Z23 Encounter for immunization: Secondary | ICD-10-CM | POA: Diagnosis not present

## 2021-07-20 ENCOUNTER — Encounter: Payer: Self-pay | Admitting: Emergency Medicine

## 2021-07-20 ENCOUNTER — Other Ambulatory Visit: Payer: Self-pay

## 2021-07-20 ENCOUNTER — Ambulatory Visit: Payer: BC Managed Care – PPO | Admitting: Emergency Medicine

## 2021-07-20 DIAGNOSIS — I10 Essential (primary) hypertension: Secondary | ICD-10-CM

## 2021-07-20 DIAGNOSIS — E782 Mixed hyperlipidemia: Secondary | ICD-10-CM

## 2021-07-20 DIAGNOSIS — E785 Hyperlipidemia, unspecified: Secondary | ICD-10-CM

## 2021-07-20 MED ORDER — ROSUVASTATIN CALCIUM 20 MG PO TABS: 20.0000 mg | ORAL_TABLET | Freq: Every day | ORAL | 3 refills | Status: AC

## 2021-07-20 MED ORDER — AMLODIPINE BESYLATE 5 MG PO TABS: 5.0000 mg | ORAL_TABLET | Freq: Every day | ORAL | 3 refills | Status: AC

## 2021-07-20 NOTE — Assessment & Plan Note (Signed)
Stable.  Needs to restart rosuvastatin 20 mg daily.

## 2021-07-20 NOTE — Assessment & Plan Note (Signed)
Uncontrolled hypertension.  Needs to restart amlodipine 5 mg daily. Dietary approaches to stop hypertension discussed.

## 2021-07-20 NOTE — Patient Instructions (Signed)

## 2021-07-20 NOTE — Progress Notes (Signed)
Patrick Wallace 36 y.o.   Chief Complaint  Patient presents with   PPD Placement    Pt need form filled out for school, return on Monday for placement   Medication Refill    Med refill of amlodipine and rosuvastin    HISTORY OF PRESENT ILLNESS: This is a 36 y.o. male with a history of hypertension and dyslipidemia needs refills on amlodipine and rosuvastatin. Has been off blood pressure medication for 2 months. Also needs TB skin test and form be filled out for school. No other complaints or medical concerns today.  Medication Refill Pertinent negatives include no abdominal pain, chest pain, chills, congestion, coughing, fever, headaches, nausea, rash, sore throat or vomiting.    Prior to Admission medications   Medication Sig Start Date End Date Taking? Authorizing Provider  albuterol (PROVENTIL HFA;VENTOLIN HFA) 108 (90 Base) MCG/ACT inhaler Inhale 2 puffs into the lungs every 6 (six) hours as needed for wheezing or shortness of breath. 07/02/16  Yes Governor Rooks, MD  amLODipine (NORVASC) 5 MG tablet Take 1 tablet (5 mg total) by mouth daily. 12/13/20  Yes Nanie Dunkleberger, Eilleen Kempf, MD  rosuvastatin (CRESTOR) 20 MG tablet Take 1 tablet (20 mg total) by mouth daily. 12/14/20  Yes Georgina Quint, MD    No Known Allergies  Patient Active Problem List   Diagnosis Date Noted   Morbid obesity (HCC) 12/13/2020   Essential hypertension 07/11/2018   Hyperlipidemia 03/03/2014   Nephrolithiasis 02/25/2013   Asthma 04/14/2012   Allergic rhinitis 01/17/2011    Past Medical History:  Diagnosis Date   Asthma    Depression    Hay fever    with allergies    Past Surgical History:  Procedure Laterality Date   LITHOTRIPSY      Social History   Socioeconomic History   Marital status: Married    Spouse name: Not on file   Number of children: Not on file   Years of education: Not on file   Highest education level: Not on file  Occupational History   Not on file  Tobacco Use    Smoking status: Never   Smokeless tobacco: Never  Vaping Use   Vaping Use: Never used  Substance and Sexual Activity   Alcohol use: Yes    Alcohol/week: 2.0 standard drinks    Types: 2 Glasses of wine per week   Drug use: Not on file   Sexual activity: Not on file  Other Topics Concern   Not on file  Social History Narrative   ** Merged History Encounter **       Social Determinants of Health   Financial Resource Strain: Not on file  Food Insecurity: Not on file  Transportation Needs: Not on file  Physical Activity: Not on file  Stress: Not on file  Social Connections: Not on file  Intimate Partner Violence: Not on file    Family History  Problem Relation Age of Onset   Hypertension Mother    Hyperlipidemia Mother    Hypertension Father    Hyperlipidemia Father    Diabetes Father    Cancer Father    Depression Father    Heart disease Father    Kidney disease Maternal Grandfather    Hypertension Maternal Grandfather    Hyperlipidemia Maternal Grandfather    Diabetes Maternal Grandfather    Hyperlipidemia Paternal Grandmother    Hypertension Paternal Grandmother    Heart attack Paternal Grandmother    Heart disease Paternal Grandmother  Review of Systems  Constitutional: Negative.  Negative for chills and fever.  HENT: Negative.  Negative for congestion and sore throat.   Respiratory: Negative.  Negative for cough and shortness of breath.   Cardiovascular: Negative.  Negative for chest pain and palpitations.  Gastrointestinal: Negative.  Negative for abdominal pain, diarrhea, nausea and vomiting.  Genitourinary: Negative.   Skin: Negative.  Negative for rash.  Neurological: Negative.  Negative for dizziness and headaches.  All other systems reviewed and are negative. Vitals:   07/20/21 1336  BP: 140/86  Pulse: 72  SpO2: 94%     Physical Exam Vitals reviewed.  Constitutional:      Appearance: Normal appearance. He is obese.  HENT:     Head:  Normocephalic.  Eyes:     Extraocular Movements: Extraocular movements intact.     Conjunctiva/sclera: Conjunctivae normal.     Pupils: Pupils are equal, round, and reactive to light.  Cardiovascular:     Rate and Rhythm: Normal rate and regular rhythm.     Pulses: Normal pulses.     Heart sounds: Normal heart sounds.  Pulmonary:     Effort: Pulmonary effort is normal.     Breath sounds: Normal breath sounds.  Musculoskeletal:     Cervical back: Neck supple. No tenderness.  Lymphadenopathy:     Cervical: No cervical adenopathy.  Skin:    General: Skin is warm and dry.     Capillary Refill: Capillary refill takes less than 2 seconds.  Neurological:     General: No focal deficit present.     Mental Status: He is alert and oriented to person, place, and time.  Psychiatric:        Mood and Affect: Mood normal.        Behavior: Behavior normal.     ASSESSMENT & PLAN: Problem List Items Addressed This Visit       Cardiovascular and Mediastinum   Essential hypertension    Uncontrolled hypertension.  Needs to restart amlodipine 5 mg daily. Dietary approaches to stop hypertension discussed.      Relevant Medications   amLODipine (NORVASC) 5 MG tablet   rosuvastatin (CRESTOR) 20 MG tablet     Other   Hyperlipidemia    Stable.  Needs to restart rosuvastatin 20 mg daily.      Relevant Medications   amLODipine (NORVASC) 5 MG tablet   rosuvastatin (CRESTOR) 20 MG tablet   Other Visit Diagnoses     Mixed dyslipidemia       Relevant Medications   rosuvastatin (CRESTOR) 20 MG tablet      Patient Instructions  Hypertension, Adult High blood pressure (hypertension) is when the force of blood pumping through the arteries is too strong. The arteries are the blood vessels that carry blood from the heart throughout the body. Hypertension forces the heart to work harder to pump blood and may cause arteries to become narrow or stiff. Untreated or uncontrolled hypertension can  cause a heart attack, heart failure, a stroke, kidney disease, and other problems. A blood pressure reading consists of a higher number over a lower number. Ideally, your blood pressure should be below 120/80. The first ("top") number is called the systolic pressure. It is a measure of the pressure in your arteries as your heart beats. The second ("bottom") number is called the diastolic pressure. It is a measure of the pressure in your arteries as the heart relaxes. What are the causes? The exact cause of this condition is not known.  There are some conditions that result in or are related to high blood pressure. What increases the risk? Some risk factors for high blood pressure are under your control. The following factors may make you more likely to develop this condition: Smoking. Having type 2 diabetes mellitus, high cholesterol, or both. Not getting enough exercise or physical activity. Being overweight. Having too much fat, sugar, calories, or salt (sodium) in your diet. Drinking too much alcohol. Some risk factors for high blood pressure may be difficult or impossible to change. Some of these factors include: Having chronic kidney disease. Having a family history of high blood pressure. Age. Risk increases with age. Race. You may be at higher risk if you are African American. Gender. Men are at higher risk than women before age 36. After age 36, women are at higher risk than men. Having obstructive sleep apnea. Stress. What are the signs or symptoms? High blood pressure may not cause symptoms. Very high blood pressure (hypertensive crisis) may cause: Headache. Anxiety. Shortness of breath. Nosebleed. Nausea and vomiting. Vision changes. Severe chest pain. Seizures. How is this diagnosed? This condition is diagnosed by measuring your blood pressure while you are seated, with your arm resting on a flat surface, your legs uncrossed, and your feet flat on the floor. The cuff of the  blood pressure monitor will be placed directly against the skin of your upper arm at the level of your heart. It should be measured at least twice using the same arm. Certain conditions can cause a difference in blood pressure between your right and left arms. Certain factors can cause blood pressure readings to be lower or higher than normal for a short period of time: When your blood pressure is higher when you are in a health care provider's office than when you are at home, this is called white coat hypertension. Most people with this condition do not need medicines. When your blood pressure is higher at home than when you are in a health care provider's office, this is called masked hypertension. Most people with this condition may need medicines to control blood pressure. If you have a high blood pressure reading during one visit or you have normal blood pressure with other risk factors, you may be asked to: Return on a different day to have your blood pressure checked again. Monitor your blood pressure at home for 1 week or longer. If you are diagnosed with hypertension, you may have other blood or imaging tests to help your health care provider understand your overall risk for other conditions. How is this treated? This condition is treated by making healthy lifestyle changes, such as eating healthy foods, exercising more, and reducing your alcohol intake. Your health care provider may prescribe medicine if lifestyle changes are not enough to get your blood pressure under control, and if: Your systolic blood pressure is above 130. Your diastolic blood pressure is above 80. Your personal target blood pressure may vary depending on your medical conditions, your age, and other factors. Follow these instructions at home: Eating and drinking  Eat a diet that is high in fiber and potassium, and low in sodium, added sugar, and fat. An example eating plan is called the DASH (Dietary Approaches to  Stop Hypertension) diet. To eat this way: Eat plenty of fresh fruits and vegetables. Try to fill one half of your plate at each meal with fruits and vegetables. Eat whole grains, such as whole-wheat pasta, brown rice, or whole-grain bread. Fill about one  fourth of your plate with whole grains. Eat or drink low-fat dairy products, such as skim milk or low-fat yogurt. Avoid fatty cuts of meat, processed or cured meats, and poultry with skin. Fill about one fourth of your plate with lean proteins, such as fish, chicken without skin, beans, eggs, or tofu. Avoid pre-made and processed foods. These tend to be higher in sodium, added sugar, and fat. Reduce your daily sodium intake. Most people with hypertension should eat less than 1,500 mg of sodium a day. Do not drink alcohol if: Your health care provider tells you not to drink. You are pregnant, may be pregnant, or are planning to become pregnant. If you drink alcohol: Limit how much you use to: 0-1 drink a day for women. 0-2 drinks a day for men. Be aware of how much alcohol is in your drink. In the [place] one drink equals one 12 oz bottle of beer (355 mL), one 5 oz glass of wine (148 mL), or one 1 oz glass of hard liquor (44 mL). Lifestyle  Work with your health care provider to maintain a healthy body weight or to lose weight. Ask what an ideal weight is for you. Get at least 30 minutes of exercise most days of the week. Activities may include walking, swimming, or biking. Include exercise to strengthen your muscles (resistance exercise), such as Pilates or lifting weights, as part of your weekly exercise routine. Try to do these types of exercises for 30 minutes at least 3 days a week. Do not use any products that contain nicotine or tobacco, such as cigarettes, e-cigarettes, and chewing tobacco. If you need help quitting, ask your health care provider. Monitor your blood pressure at home as told by your health care provider. Keep all follow-up  visits as told by your health care provider. This is important. Medicines Take over-the-counter and prescription medicines only as told by your health care provider. Follow directions carefully. Blood pressure medicines must be taken as prescribed. Do not skip doses of blood pressure medicine. Doing this puts you at risk for problems and can make the medicine less effective. Ask your health care provider about side effects or reactions to medicines that you should watch for. Contact a health care provider if you: Think you are having a reaction to a medicine you are taking. Have headaches that keep coming back (recurring). Feel dizzy. Have swelling in your ankles. Have trouble with your vision. Get help right away if you: Develop a severe headache or confusion. Have unusual weakness or numbness. Feel faint. Have severe pain in your chest or abdomen. Vomit repeatedly. Have trouble breathing. Summary Hypertension is when the force of blood pumping through your arteries is too strong. If this condition is not controlled, it may put you at risk for serious complications. Your personal target blood pressure may vary depending on your medical conditions, your age, and other factors. For most people, a normal blood pressure is less than 120/80. Hypertension is treated with lifestyle changes, medicines, or a combination of both. Lifestyle changes include losing weight, eating a healthy, low-sodium diet, exercising more, and limiting alcohol. This information is not intended to replace advice given to you by your health care provider. Make sure you discuss any questions you have with your health care provider. Document Revised: 02/12/2018 Document Reviewed: 02/12/2018 Elsevier Patient Education  2022 [employer]     Edwina BarthMiguel Tony Friscia, MD [facility]

## 2021-07-24 ENCOUNTER — Other Ambulatory Visit: Payer: Self-pay

## 2021-07-24 ENCOUNTER — Ambulatory Visit (INDEPENDENT_AMBULATORY_CARE_PROVIDER_SITE_OTHER): Payer: BC Managed Care – PPO

## 2021-07-24 DIAGNOSIS — Z111 Encounter for screening for respiratory tuberculosis: Secondary | ICD-10-CM

## 2021-07-24 NOTE — Progress Notes (Signed)
Pt had PPD place this morning at [id number] w/o any complications.

## 2021-07-26 ENCOUNTER — Other Ambulatory Visit: Payer: Self-pay

## 2021-07-26 ENCOUNTER — Ambulatory Visit: Payer: BC Managed Care – PPO | Admitting: Emergency Medicine

## 2021-07-26 ENCOUNTER — Ambulatory Visit: Payer: BC Managed Care – PPO

## 2021-07-26 DIAGNOSIS — Z111 Encounter for screening for respiratory tuberculosis: Secondary | ICD-10-CM

## 2021-07-26 LAB — TB SKIN TEST
Induration: 0 mm
TB Skin Test: NEGATIVE

## 2021-07-26 NOTE — Progress Notes (Signed)
Patient came in for TB skin test reading today and it was negative. Form completed and given back to patient.

## 2021-07-26 NOTE — Progress Notes (Signed)
Nice! Thank you! :)

## 2022-08-14 ENCOUNTER — Telehealth: Payer: Self-pay

## 2022-08-14 NOTE — Transitions of Care (Post Inpatient/ED Visit) (Signed)
   08/14/2022  Name: Patrick Wallace MRN: [id number] DOB: 10/25/1985  Today's TOC FU Call Status: Today's TOC FU Call Status:: Unsuccessul Call (1st Attempt) Unsuccessful Call (1st Attempt) Date: 08/14/22  Attempted to reach the patient regarding the most recent Inpatient/ED visit.  Follow Up Plan: Additional outreach attempts will be made to reach the patient to complete the Transitions of Care (Post Inpatient/ED visit) call.   Signature Juanda Crumble, [place] Direct Dial [phone]

## 2022-08-15 NOTE — Transitions of Care (Post Inpatient/ED Visit) (Signed)
   08/15/2022  Name: Patrick Wallace MRN: [id number] DOB: [birth year]  Today's TOC FU Call Status: Today's TOC FU Call Status:: Unsuccessful Call (2nd Attempt) Unsuccessful Call (1st Attempt) Date: 08/14/22 Unsuccessful Call (2nd Attempt) Date: 08/15/22  Attempted to reach the patient regarding the most recent Inpatient/ED visit.  Follow Up Plan: No further outreach attempts will be made at this time. We have been unable to contact the patient.  Signature Juanda Crumble, [place] Direct Dial [phone]
